# Patient Record
Sex: Female | Born: 1995 | Hispanic: No | Marital: Single | Smoking: Never smoker
Health system: Southern US, Community
[De-identification: ages and names within clinical notes are randomized; demographics above are authoritative.]

## PROBLEM LIST (undated history)

## (undated) DIAGNOSIS — I10 Essential (primary) hypertension: Secondary | ICD-10-CM

---
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
## 2016-03-11 ENCOUNTER — Ambulatory Visit (HOSPITAL_COMMUNITY)
Admission: EM | Admit: 2016-03-11 | Discharge: 2016-03-11 | Disposition: A | Discharge status: Home / Self Care | Payer: Medicaid Other | Attending: Emergency Medicine | Admitting: Emergency Medicine

## 2016-03-11 ENCOUNTER — Encounter (HOSPITAL_COMMUNITY): Payer: Self-pay | Admitting: Emergency Medicine

## 2016-03-11 DIAGNOSIS — T148 Other injury of unspecified body region: Secondary | ICD-10-CM

## 2016-03-11 DIAGNOSIS — S161XXA Strain of muscle, fascia and tendon at neck level, initial encounter: Secondary | ICD-10-CM

## 2016-03-11 DIAGNOSIS — T148XXA Other injury of unspecified body region, initial encounter: Secondary | ICD-10-CM

## 2016-03-11 MED ORDER — DICLOFENAC POTASSIUM 50 MG PO TABS: 50.0000 mg | ORAL_TABLET | Freq: Three times a day (TID) | ORAL | 0 refills | Status: AC

## 2016-03-11 MED ORDER — METHOCARBAMOL 500 MG PO TABS: ORAL_TABLET | ORAL | 0 refills | Status: AC
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
## 2016-03-28 ENCOUNTER — Other Ambulatory Visit: Payer: Medicaid Other

## 2016-03-28 ENCOUNTER — Other Ambulatory Visit: Payer: Self-pay | Admitting: *Deleted

## 2016-03-28 ENCOUNTER — Ambulatory Visit
Admission: RE | Admit: 2016-03-28 | Discharge: 2016-03-28 | Disposition: A | Discharge status: Home / Self Care | Payer: Self-pay | Source: Ambulatory Visit | Attending: *Deleted | Admitting: *Deleted

## 2016-03-28 DIAGNOSIS — M542 Cervicalgia: Secondary | ICD-10-CM

## 2016-03-28 DIAGNOSIS — M546 Pain in thoracic spine: Secondary | ICD-10-CM
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
